# Patient Record
Sex: Male | Born: 1995 | Race: Black or African American | Hispanic: No | Marital: Single | Smoking: Never smoker
Health system: Southern US, Community
[De-identification: ages and names within clinical notes are randomized; demographics above are authoritative.]

## PROBLEM LIST (undated history)

## (undated) DIAGNOSIS — J45909 Unspecified asthma, uncomplicated: Secondary | ICD-10-CM

## (undated) HISTORY — DX: Unspecified asthma, uncomplicated: J45.909

---

## 2014-03-09 ENCOUNTER — Encounter: Payer: Self-pay | Admitting: Family

## 2014-03-09 ENCOUNTER — Ambulatory Visit (INDEPENDENT_AMBULATORY_CARE_PROVIDER_SITE_OTHER): Payer: Self-pay | Admitting: Family

## 2014-03-09 VITALS — BP 100/62 | HR 80 | Ht 73.0 in | Wt 145.0 lb

## 2014-03-09 DIAGNOSIS — J069 Acute upper respiratory infection, unspecified: Secondary | ICD-10-CM

## 2014-03-09 DIAGNOSIS — Z23 Encounter for immunization: Secondary | ICD-10-CM

## 2014-03-09 DIAGNOSIS — G4700 Insomnia, unspecified: Secondary | ICD-10-CM

## 2014-03-09 LAB — CBC WITH DIFFERENTIAL/PLATELET
BASOS ABS: 0 10*3/uL (ref 0.0–0.1)
Basophils Relative: 0.4 % (ref 0.0–3.0)
EOS ABS: 0.1 10*3/uL (ref 0.0–0.7)
Eosinophils Relative: 1.8 % (ref 0.0–5.0)
HEMATOCRIT: 46.4 % (ref 39.0–52.0)
Hemoglobin: 15 g/dL (ref 13.0–17.0)
LYMPHS ABS: 1.6 10*3/uL (ref 0.7–4.0)
Lymphocytes Relative: 21.4 % (ref 12.0–46.0)
MCHC: 32.3 g/dL (ref 30.0–36.0)
MCV: 93.7 fl (ref 78.0–100.0)
MONO ABS: 0.7 10*3/uL (ref 0.1–1.0)
Monocytes Relative: 9.1 % (ref 3.0–12.0)
NEUTROS ABS: 5 10*3/uL (ref 1.4–7.7)
Neutrophils Relative %: 67.3 % (ref 43.0–77.0)
Platelets: 210 10*3/uL (ref 150.0–400.0)
RBC: 4.95 Mil/uL (ref 4.22–5.81)
RDW: 13.9 % (ref 11.5–14.6)
WBC: 7.4 10*3/uL (ref 4.5–10.5)

## 2014-03-09 LAB — TSH: TSH: 0.64 u[IU]/mL (ref 0.35–5.50)

## 2014-03-09 LAB — COMPREHENSIVE METABOLIC PANEL
ALT: 17 U/L (ref 0–53)
AST: 26 U/L (ref 0–37)
Albumin: 3.7 g/dL (ref 3.5–5.2)
Alkaline Phosphatase: 80 U/L (ref 39–117)
BUN: 11 mg/dL (ref 6–23)
CO2: 29 meq/L (ref 19–32)
CREATININE: 1.1 mg/dL (ref 0.4–1.5)
Calcium: 9.6 mg/dL (ref 8.4–10.5)
Chloride: 103 mEq/L (ref 96–112)
GFR: 111.35 mL/min (ref >60.00)
Glucose, Bld: 98 mg/dL (ref 70–99)
Potassium: 4.8 mEq/L (ref 3.5–5.1)
Sodium: 142 mEq/L (ref 135–145)
Total Bilirubin: 2.7 mg/dL — ABNORMAL HIGH (ref 0.2–1.2)
Total Protein: 7.8 g/dL (ref 6.0–8.3)

## 2014-03-09 LAB — T3: T3, Total: 81.6 ng/dL (ref 80.0–204.0)

## 2014-03-09 LAB — T4: T4, Total: 5.6 ug/dL (ref 4.5–12.0)

## 2014-03-09 NOTE — Progress Notes (Signed)
Pre visit review using our clinic review tool, if applicable. No additional management support is needed unless otherwise documented below in the visit note. 

## 2014-03-09 NOTE — Progress Notes (Signed)
   Subjective:    Patient ID: Dan Martinez, male    DOB: 12/05/1995, 18 y.o.   MRN: [id number]  Cough   18 year old [place], new patient to the practice is in today to be established. Has concerns insomnia that has been a long time issue for several years. Reports his mother used to give him a sleep aid at night that helped. However, since she has been deceased he is unsure what she was given him. Reports loud snoring. Father has sleep apnea. Often feels fatigued the next morning.  He also has concerns of cough, congestion x2 days. Has been taking over-the-counter Mucinex that does help. Denies any fever or chills.   Review of Systems  HENT: Positive for congestion.   Eyes: Negative.   Respiratory: Positive for cough.   Cardiovascular: Negative.   Gastrointestinal: Negative.   Endocrine: Negative.   Genitourinary: Negative.   Musculoskeletal: Negative.   Skin: Negative.   Neurological: Negative.   Hematological: Negative.   Psychiatric/Behavioral: Positive for sleep disturbance.   Past Medical History  Diagnosis Date  . Asthma     History   Social History  . Marital Status: Single    Spouse Name: N/A    Number of Children: N/A  . Years of Education: N/A   Occupational History  . Not on file.   Social History Main Topics  . Smoking status: Never Smoker   . Smokeless tobacco: Not on file  . Alcohol Use: Not on file  . Drug Use: Not on file  . Sexual Activity: Not on file   Other Topics Concern  . Not on file   Social History Narrative  . No narrative on file    History reviewed. No pertinent past surgical history.  Family History  Problem Relation Age of Onset  . Sudden death Mother   . Hypertension Father   . Diabetes Father     No Known Allergies  No current outpatient prescriptions on file prior to visit.   No current facility-administered medications on file prior to visit.    BP 100/62  Pulse 80  Ht 6\' 1"  (1.854 m)  Wt 145 lb  (65.772 kg)  BMI 19.13 kg/m2chart    Objective:   Physical Exam  Constitutional: He is oriented to person, place, and time. He appears well-developed and well-nourished.  HENT:  Right Ear: External ear normal.  Left Ear: External ear normal.  Nose: Nose normal.  Mouth/Throat: Oropharynx is clear and moist.  Neck: Normal range of motion. Neck supple. No thyromegaly present.  Cardiovascular: Normal rate, regular rhythm and normal heart sounds.   Pulmonary/Chest: Effort normal and breath sounds normal.  Musculoskeletal: Normal range of motion.  Neurological: He is alert and oriented to person, place, and time.  Skin: Skin is warm and dry.  Psychiatric: He has a normal mood and affect.          Assessment & Plan:  Dan Martinez was seen today for establish care, cough, nasal congestion and insomnia.  Diagnoses and associated orders for this visit:  Acute upper respiratory infection - TSH - T3 - T4 - CBC with Differential - CMP  Insomnia - TSH - T3 - T4 - CBC with Differential - CMP  Encounter for immunization    consider overnight oximetry to evaluate for sleep apnea in his father has it. Consider trazodone or Benadryl at night for insomnia. We'll make a decision pending labs.

## 2014-03-09 NOTE — Patient Instructions (Addendum)
1. Zyrtec D or Claritin D OTC twice a day.   Upper Respiratory Infection, Adult An upper respiratory infection (URI) is also sometimes known as the common cold. The upper respiratory tract includes the nose, sinuses, throat, trachea, and bronchi. Bronchi are the airways leading to the lungs. Most people improve within 1 week, but symptoms can last up to 2 weeks. A residual cough may last even longer.  CAUSES Many different viruses can infect the tissues lining the upper respiratory tract. The tissues become irritated and inflamed and often become very moist. Mucus production is also common. A cold is contagious. You can easily spread the virus to others by oral contact. This includes kissing, sharing a glass, coughing, or sneezing. Touching your mouth or nose and then touching a surface, which is then touched by another person, can also spread the virus. SYMPTOMS  Symptoms typically develop 1 to 3 days after you come in contact with a cold virus. Symptoms vary from person to person. They may include:  Runny nose.  Sneezing.  Nasal congestion.  Sinus irritation.  Sore throat.  Loss of voice (laryngitis).  Cough.  Fatigue.  Muscle aches.  Loss of appetite.  Headache.  Low-grade fever. DIAGNOSIS  You might diagnose your own cold based on familiar symptoms, since most people get a cold 2 to 3 times a year. Your caregiver can confirm this based on your exam. Most importantly, your caregiver can check that your symptoms are not due to another disease such as strep throat, sinusitis, pneumonia, asthma, or epiglottitis. Blood tests, throat tests, and X-rays are not necessary to diagnose a common cold, but they may sometimes be helpful in excluding other more serious diseases. Your caregiver will decide if any further tests are required. RISKS AND COMPLICATIONS  You may be at risk for a more severe case of the common cold if you smoke cigarettes, have chronic heart disease (such as heart  failure) or lung disease (such as asthma), or if you have a weakened immune system. The very young and very old are also at risk for more serious infections. Bacterial sinusitis, middle ear infections, and bacterial pneumonia can complicate the common cold. The common cold can worsen asthma and chronic obstructive pulmonary disease (COPD). Sometimes, these complications can require emergency medical care and may be life-threatening. PREVENTION  The best way to protect against getting a cold is to practice good hygiene. Avoid oral or hand contact with people with cold symptoms. Wash your hands often if contact occurs. There is no clear evidence that vitamin C, vitamin E, echinacea, or exercise reduces the chance of developing a cold. However, it is always recommended to get plenty of rest and practice good nutrition. TREATMENT  Treatment is directed at relieving symptoms. There is no cure. Antibiotics are not effective, because the infection is caused by a virus, not by bacteria. Treatment may include:  Increased fluid intake. Sports drinks offer valuable electrolytes, sugars, and fluids.  Breathing heated mist or steam (vaporizer or shower).  Eating chicken soup or other clear broths, and maintaining good nutrition.  Getting plenty of rest.  Using gargles or lozenges for comfort.  Controlling fevers with ibuprofen or acetaminophen as directed by your caregiver.  Increasing usage of your inhaler if you have asthma. Zinc gel and zinc lozenges, taken in the first 24 hours of the common cold, can shorten the duration and lessen the severity of symptoms. Pain medicines may help with fever, muscle aches, and throat pain. A variety of  non-prescription medicines are available to treat congestion and runny nose. Your caregiver can make recommendations and may suggest nasal or lung inhalers for other symptoms.  HOME CARE INSTRUCTIONS   Only take over-the-counter or prescription medicines for pain,  discomfort, or fever as directed by your caregiver.  Use a warm mist humidifier or inhale steam from a shower to increase air moisture. This may keep secretions moist and make it easier to breathe.  Drink enough water and fluids to keep your urine clear or pale yellow.  Rest as needed.  Return to work when your temperature has returned to normal or as your caregiver advises. You may need to stay home longer to avoid infecting others. You can also use a face mask and careful hand washing to prevent spread of the virus. SEEK MEDICAL CARE IF:   After the first few days, you feel you are getting worse rather than better.  You need your caregiver's advice about medicines to control symptoms.  You develop chills, worsening shortness of breath, or brown or red sputum. These may be signs of pneumonia.  You develop yellow or brown nasal discharge or pain in the face, especially when you bend forward. These may be signs of sinusitis.  You develop a fever, swollen neck glands, pain with swallowing, or white areas in the back of your throat. These may be signs of strep throat. SEEK IMMEDIATE MEDICAL CARE IF:   You have a fever.  You develop severe or persistent headache, ear pain, sinus pain, or chest pain.  You develop wheezing, a prolonged cough, cough up blood, or have a change in your usual mucus (if you have chronic lung disease).  You develop sore muscles or a stiff neck. Document Released: 11/01/2000 Document Revised: 07/31/2011 Document Reviewed: 08/13/2013 [facility] Patient Information 2015 [place], [place]. This information is not intended to replace advice given to you by your health care provider. Make sure you discuss any questions you have with your health care provider.   Insomnia Insomnia is frequent trouble falling and/or staying asleep. Insomnia can be a long term problem or a short term problem. Both are common. Insomnia can be a short term problem when the wakefulness is  related to a certain stress or worry. Long term insomnia is often related to ongoing stress during waking hours and/or poor sleeping habits. Overtime, sleep deprivation itself can make the problem worse. Every little thing feels more severe because you are overtired and your ability to cope is decreased. CAUSES   Stress, anxiety, and depression.  Poor sleeping habits.  Distractions such as TV in the bedroom.  Naps close to bedtime.  Engaging in emotionally charged conversations before bed.  Technical reading before sleep.  Alcohol and other sedatives. They may make the problem worse. They can hurt normal sleep patterns and normal dream activity.  Stimulants such as caffeine for several hours prior to bedtime.  Pain syndromes and shortness of breath can cause insomnia.  Exercise late at night.  Changing time zones may cause sleeping problems (jet lag). It is sometimes helpful to have someone observe your sleeping patterns. They should look for periods of not breathing during the night (sleep apnea). They should also look to see how long those periods last. If you live alone or observers are uncertain, you can also be observed at a sleep clinic where your sleep patterns will be professionally monitored. Sleep apnea requires a checkup and treatment. Give your caregivers your medical history. Give your caregivers observations your family  has made about your sleep.  SYMPTOMS   Not feeling rested in the morning.  Anxiety and restlessness at bedtime.  Difficulty falling and staying asleep. TREATMENT   Your caregiver may prescribe treatment for an underlying medical disorders. Your caregiver can give advice or help if you are using alcohol or other drugs for self-medication. Treatment of underlying problems will usually eliminate insomnia problems.  Medications can be prescribed for short time use. They are generally not recommended for lengthy use.  Over-the-counter sleep medicines are  not recommended for lengthy use. They can be habit forming.  You can promote easier sleeping by making lifestyle changes such as:  Using relaxation techniques that help with breathing and reduce muscle tension.  Exercising earlier in the day.  Changing your diet and the time of your last meal. No night time snacks.  Establish a regular time to go to bed.  Counseling can help with stressful problems and worry.  Soothing music and white noise may be helpful if there are background noises you cannot remove.  Stop tedious detailed work at least one hour before bedtime. HOME CARE INSTRUCTIONS   Keep a diary. Inform your caregiver about your progress. This includes any medication side effects. See your caregiver regularly. Take note of:  Times when you are asleep.  Times when you are awake during the night.  The quality of your sleep.  How you feel the next day. This information will help your caregiver care for you.  Get out of bed if you are still awake after 15 minutes. Read or do some quiet activity. Keep the lights down. Wait until you feel sleepy and go back to bed.  Keep regular sleeping and waking hours. Avoid naps.  Exercise regularly.  Avoid distractions at bedtime. Distractions include watching television or engaging in any intense or detailed activity like attempting to balance the household checkbook.  Develop a bedtime ritual. Keep a familiar routine of bathing, brushing your teeth, climbing into bed at the same time each night, listening to soothing music. Routines increase the success of falling to sleep faster.  Use relaxation techniques. This can be using breathing and muscle tension release routines. It can also include visualizing peaceful scenes. You can also help control troubling or intruding thoughts by keeping your mind occupied with boring or repetitive thoughts like the old concept of counting sheep. You can make it more creative like imagining planting  one beautiful flower after another in your backyard garden.  During your day, work to eliminate stress. When this is not possible use some of the previous suggestions to help reduce the anxiety that accompanies stressful situations. MAKE SURE YOU:   Understand these instructions.  Will watch your condition.  Will get help right away if you are not doing well or get worse. Document Released: 05/05/2000 Document Revised: 07/31/2011 Document Reviewed: 06/05/2007 [facility] Patient Information 2015 [place], [place]. This information is not intended to replace advice given to you by your health care provider. Make sure you discuss any questions you have with your health care provider.

## 2014-03-10 ENCOUNTER — Other Ambulatory Visit: Payer: Self-pay | Admitting: Family

## 2014-03-10 DIAGNOSIS — G4700 Insomnia, unspecified: Secondary | ICD-10-CM

## 2014-03-10 DIAGNOSIS — Z820 Family history of epilepsy and other diseases of the nervous system: Secondary | ICD-10-CM

## 2014-03-10 DIAGNOSIS — R5383 Other fatigue: Secondary | ICD-10-CM

## 2014-04-10 ENCOUNTER — Telehealth: Payer: Self-pay | Admitting: Family

## 2014-04-10 NOTE — Telephone Encounter (Signed)
Pt would like sleep apnea results. Pt is aware NP out of office

## 2014-04-10 NOTE — Telephone Encounter (Signed)
Pt aware results have been received and will be reviewed by provider next week. I will call pt once results are reviewed

## 2014-04-13 ENCOUNTER — Telehealth: Payer: Self-pay | Admitting: Family

## 2014-04-13 ENCOUNTER — Other Ambulatory Visit: Payer: Self-pay | Admitting: Family

## 2014-04-13 DIAGNOSIS — G4734 Idiopathic sleep related nonobstructive alveolar hypoventilation: Secondary | ICD-10-CM

## 2014-04-13 NOTE — Telephone Encounter (Signed)
Pt would like a call back about sleep apnea results

## 2014-04-13 NOTE — Telephone Encounter (Signed)
[facility]. Per Oran ReinPadonda, refer to pulmonology due to desaturation indicated on sleep study.   Pt aware and referral placed

## 2014-05-01 ENCOUNTER — Telehealth: Payer: Self-pay | Admitting: Family

## 2014-05-01 ENCOUNTER — Ambulatory Visit (INDEPENDENT_AMBULATORY_CARE_PROVIDER_SITE_OTHER): Payer: Managed Care, Other (non HMO) | Admitting: Pulmonary Disease

## 2014-05-01 ENCOUNTER — Encounter: Payer: Self-pay | Admitting: Family

## 2014-05-01 ENCOUNTER — Encounter: Payer: Self-pay | Admitting: *Deleted

## 2014-05-01 VITALS — BP 110/62 | HR 66 | Ht 74.0 in | Wt 146.0 lb

## 2014-05-01 DIAGNOSIS — F5104 Psychophysiologic insomnia: Secondary | ICD-10-CM

## 2014-05-01 DIAGNOSIS — G4734 Idiopathic sleep related nonobstructive alveolar hypoventilation: Secondary | ICD-10-CM

## 2014-05-01 NOTE — Telephone Encounter (Signed)
error 

## 2014-05-01 NOTE — Assessment & Plan Note (Signed)
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 I have explained that medications are not the way to treat this problem, and would recommend referral to a behavioral psychologist for CBT. I have discussed with him sleep restriction therapy, and the importance of starting his day by 8:00 in order to maintain regular schedule.

## 2014-05-01 NOTE — Progress Notes (Signed)
Subjective:    Patient ID: Estill CottaJalen Finch, male    DOB: 08/15/1995, 18 y.o.   MRN: [id number]  HPI The patient is a 18 year old male who I've been asked to see for possible obstructive sleep apnea. He has a long-standing history of chronic insomnia, and apparently had a recent overnight oximetry that was abnormal. I do not see any documentation in the chart, and the report is not available to me currently. The question has been raised whether he may have obstructive sleep apnea, and he tells me that his father does have sleep disordered breathing. He has been noted to have loud snoring, but no one has ever commented on an abnormal breathing pattern during sleep. He typically will sleep during the day, from 5 AM to 3 PM. He is not rested at least 60% of the time upon arising. He does note some sleepiness with inactivity while awake, but it is variable. He feels that sleepiness does impact his schoolwork and his study time. He has intermittent sleepiness watching television or movies, and no sleepiness with driving. The patient states that his weight is unchanged over the last few years.    Sleep Questionnaire What time do you typically go to bed?( Between what hours) 5am-10am 5am-10am at 1140 on 05/01/14 by Tommie SamsMindy S Silva, CMA How long does it take you to fall asleep? 5-10 min 5-10 min at 1140 on 05/01/14 by Tommie SamsMindy S Silva, CMA How many times during the night do you wake up? 2 2 at 1140 on 05/01/14 by Tommie SamsMindy S Silva, CMA What time do you get out of bed to start your day? 0200 0200 at 1140 on 05/01/14 by Tommie SamsMindy S Silva, CMA Do you drive or operate heavy machinery in your occupation? No No at 1140 on 05/01/14 by Tommie SamsMindy S Silva, CMA How much has your weight changed (up or down) over the past two years? (In pounds) 0 oz (0 kg) 0 oz (0 kg) at 1140 on 05/01/14 by Tommie SamsMindy S Silva, CMA Have you ever had a sleep study before? No No at 1140 on 05/01/14 by Tommie SamsMindy S Silva, CMA Do you currently use CPAP?  No No at 1140 on 05/01/14 by Tommie SamsMindy S Silva, CMA Do you wear oxygen at any time? No No at 1140 on 05/01/14 by Tommie SamsMindy S Silva, CMA   Review of Systems  Constitutional: Positive for appetite change. Negative for fever and unexpected weight change.  HENT: Negative for congestion, dental problem, ear pain, nosebleeds, postnasal drip, rhinorrhea, sinus pressure, sneezing, sore throat and trouble swallowing.   Eyes: Negative for redness and itching.  Respiratory: Negative for cough, chest tightness, shortness of breath and wheezing.   Cardiovascular: Negative for palpitations and leg swelling.  Gastrointestinal: Negative for nausea and vomiting.  Genitourinary: Negative for dysuria.  Musculoskeletal: Negative for joint swelling.  Skin: Negative for rash.  Neurological: Negative for headaches.  Hematological: Does not bruise/bleed easily.  Psychiatric/Behavioral: Negative for dysphoric mood. The patient is not nervous/anxious.        Objective:   Physical Exam Constitutional:  Well developed, no acute distress  HENT:  Nares patent without discharge, septal deviation to the right with narrowing  Oropharynx without exudate, palate and uvula are moderately elongated  Eyes:  Perrla, eomi, no scleral icterus  Neck:  No JVD, no TMG  Cardiovascular:  Normal rate, regular rhythm, no rubs or gallops.  No murmurs        Intact distal pulses  Pulmonary :  Normal breath sounds,  no stridor or respiratory distress   No rales, rhonchi, or wheezing  Abdominal:  Soft, nondistended, bowel sounds present.  No tenderness noted.   Musculoskeletal:  No lower extremity edema noted.  Lymph Nodes:  No cervical lymphadenopathy noted  Skin:  No cyanosis noted  Neurologic:  Alert, appropriate, moves all 4 extremities without obvious deficit.         Assessment & Plan:

## 2014-05-01 NOTE — Patient Instructions (Signed)
Will schedule for a sleep study starting around 5am. Work on getting your sleep schedule more normalized. Will send a note to your primary physician, recommending a referral to behavioral psychology for CBT (cognitive behavioral therapy).  [facility] Will call you once the results of your sleep study is available.

## 2014-05-01 NOTE — Assessment & Plan Note (Signed)
The patient apparently has nocturnal desaturation on his overnight oximetry, but this data is not available to me currently. Have a history of loud snoring, and his father has obstructive sleep apnea. Even though he does not have the classic body habitus for sleep disordered breathing, he does have nonrestorative sleep even when he can sleep 6 or 7 hours at a time. I think he will need to have a sleep study for evaluation, and will arrange this.

## 2014-05-25 ENCOUNTER — Ambulatory Visit (HOSPITAL_BASED_OUTPATIENT_CLINIC_OR_DEPARTMENT_OTHER): Payer: Managed Care, Other (non HMO)

## 2014-05-26 ENCOUNTER — Encounter (HOSPITAL_BASED_OUTPATIENT_CLINIC_OR_DEPARTMENT_OTHER): Payer: Managed Care, Other (non HMO)

## 2014-05-27 ENCOUNTER — Ambulatory Visit (HOSPITAL_BASED_OUTPATIENT_CLINIC_OR_DEPARTMENT_OTHER): Payer: Managed Care, Other (non HMO) | Attending: Pulmonary Disease | Admitting: Radiology

## 2014-05-27 VITALS — Wt 150.0 lb

## 2014-05-27 DIAGNOSIS — F5104 Psychophysiologic insomnia: Secondary | ICD-10-CM | POA: Diagnosis not present

## 2014-05-27 DIAGNOSIS — G4733 Obstructive sleep apnea (adult) (pediatric): Secondary | ICD-10-CM | POA: Insufficient documentation

## 2014-05-27 DIAGNOSIS — G4734 Idiopathic sleep related nonobstructive alveolar hypoventilation: Secondary | ICD-10-CM | POA: Diagnosis not present

## 2014-06-03 DIAGNOSIS — G4733 Obstructive sleep apnea (adult) (pediatric): Secondary | ICD-10-CM

## 2014-06-03 NOTE — Sleep Study (Signed)
   NAME: Dan CottaJalen Martinez DATE OF BIRTH:  1995/08/12 MEDICAL RECORD NUMBER [id number]  LOCATION: [facility]  PHYSICIAN: Barbaraann ShareCLANCE,KEITH M  DATE OF STUDY: 05/27/2014  SLEEP STUDY TYPE: Nocturnal Polysomnogram               REFERRING PHYSICIAN: Clance, Maree KrabbeKeith M, MD  INDICATION FOR STUDY: Hypersomnia with sleep apnea  EPWORTH SLEEPINESS SCORE:  9 HEIGHT:  (6'3")  WEIGHT: 150 lb (68.04 kg)    Cannot calculate BMI with a height equal to zero.  NECK SIZE: 13 in.  MEDICATIONS: Reviewed in the sleep record  SLEEP ARCHITECTURE: The patient had a total sleep time of 307 minutes, with no slow-wave sleep and only 87 minutes of REM. Sleep onset latency was normal at 30 minutes, and REM onset was normal at 52 minutes. Sleep efficiency was mildly reduced at 85%.  RESPIRATORY DATA: The patient was found to have 2 apneas and no obstructive hypopneas, giving him an AHI of only 0.4 events per hour. The events occurred all during REM, and there was very loud snoring noted throughout. It should be noted the patient did not have any significant respiratory effort-related arousals.  OXYGEN DATA: There was oxygen desaturation transiently as low as 94%  CARDIAC DATA: No clinically significant arrhythmias were noted  MOVEMENT/PARASOMNIA: No significant limb movements or other abnormal behaviors were seen.  IMPRESSION/ RECOMMENDATION:    1) Small numbers of obstructive events which do not meet the AHI criteria for the obstructive sleep apnea syndrome. The patient did have loud snoring, but had no evidence for the upper airway resistant syndrome.    Barbaraann ShareLANCE,KEITH M Diplomate, American Board of Sleep Medicine  ELECTRONICALLY SIGNED ON:  06/03/2014, 6:13 PM [facility] PH: [phone]   FX: [phone] ACCREDITED BY THE AMERICAN ACADEMY OF SLEEP MEDICINE

## 2014-06-03 NOTE — Progress Notes (Signed)
Dan Martinez, please let pt know that his sleep study did not show sleep apnea.  He did have loud snoring.  I think a lot of his issues are related to his schedule and sleep hygiene as we discussed.  If he continues to have issues with insomnia, he needs to discuss referral to behavioral psychology with his primary md.

## 2014-06-04 NOTE — Progress Notes (Signed)
I spoke with patient about results and he verbalized understanding and had no questions 

## 2014-12-29 ENCOUNTER — Telehealth: Payer: Self-pay | Admitting: Pulmonary Disease

## 2014-12-29 NOTE — Telephone Encounter (Signed)
Pt in lobby requesting a letter saying that he was seen in our office and had a sleep study.  This has been typed and given to patient.  Nothing further needed at this time.

## 2014-12-31 ENCOUNTER — Encounter: Payer: Self-pay | Admitting: *Deleted

## 2015-09-23 ENCOUNTER — Emergency Department (HOSPITAL_COMMUNITY)
Admission: EM | Admit: 2015-09-23 | Discharge: 2015-09-24 | Disposition: A | Discharge status: Home / Self Care | Payer: Managed Care, Other (non HMO) | Attending: Emergency Medicine | Admitting: Emergency Medicine

## 2015-09-23 ENCOUNTER — Emergency Department (HOSPITAL_COMMUNITY): Payer: Managed Care, Other (non HMO)

## 2015-09-23 ENCOUNTER — Encounter (HOSPITAL_COMMUNITY): Payer: Self-pay

## 2015-09-23 DIAGNOSIS — Y9289 Other specified places as the place of occurrence of the external cause: Secondary | ICD-10-CM | POA: Diagnosis not present

## 2015-09-23 DIAGNOSIS — S5292XA Unspecified fracture of left forearm, initial encounter for closed fracture: Secondary | ICD-10-CM

## 2015-09-23 DIAGNOSIS — Y998 Other external cause status: Secondary | ICD-10-CM | POA: Insufficient documentation

## 2015-09-23 DIAGNOSIS — Y9367 Activity, basketball: Secondary | ICD-10-CM | POA: Insufficient documentation

## 2015-09-23 DIAGNOSIS — J45909 Unspecified asthma, uncomplicated: Secondary | ICD-10-CM | POA: Diagnosis not present

## 2015-09-23 DIAGNOSIS — W1839XA Other fall on same level, initial encounter: Secondary | ICD-10-CM | POA: Diagnosis not present

## 2015-09-23 DIAGNOSIS — S50812A Abrasion of left forearm, initial encounter: Secondary | ICD-10-CM | POA: Insufficient documentation

## 2015-09-23 DIAGNOSIS — S52602A Unspecified fracture of lower end of left ulna, initial encounter for closed fracture: Secondary | ICD-10-CM | POA: Insufficient documentation

## 2015-09-23 DIAGNOSIS — S6992XA Unspecified injury of left wrist, hand and finger(s), initial encounter: Secondary | ICD-10-CM | POA: Diagnosis present

## 2015-09-23 NOTE — ED Provider Notes (Signed)
CSN: [id number]     Arrival date & time 09/23/15  2246 History  By signing my name below, I, Freida Busmaniana Omoyeni, attest that this documentation has been prepared under the direction and in the presence of non-physician practitioner, Wynetta EmeryNicole Marcell Pfeifer, PA-C. Electronically Signed: Freida Busmaniana Omoyeni, Scribe. 09/23/2015. 11:45 PM.    Chief Complaint  Patient presents with  . Wrist Injury   The history is provided by the patient. No language interpreter was used.     HPI Comments:  Dan Martinez is a 20 y.o. male who presents to the Emergency Department complaining of 6.8/10 constant pain to the left wrist following injury today. Pt was playing basketball when he fell backward onto outstretched left arm; no LOC or head injury. No alleviating factors noted. Pt has no other complaints or symptoms at this time. Pt is right hand dominant.   Past Medical History  Diagnosis Date  . Asthma    History reviewed. No pertinent past surgical history. Family History  Problem Relation Age of Onset  . Sudden death Mother   . Hypertension Father   . Diabetes Father    Social History  Substance Use Topics  . Smoking status: Never Smoker   . Smokeless tobacco: Never Used  . Alcohol Use: No    Review of Systems  10 systems reviewed and all are negative for acute change except as noted in the HPI.  Allergies  Review of patient's allergies indicates no known allergies.  Home Medications   Prior to Admission medications   Not on File   BP 122/98 mmHg  Pulse 88  Temp(Src) 98.6 F (37 C) (Oral)  Resp 16  SpO2 100% Physical Exam  Constitutional: He is oriented to person, place, and time. He appears well-developed and well-nourished. No distress.  HENT:  Head: Normocephalic and atraumatic.  Eyes: Conjunctivae and EOM are normal.  Cardiovascular: Normal rate.   Pulmonary/Chest: Effort normal. No stridor.  Abdominal: He exhibits no distension.  Musculoskeletal: He exhibits edema and tenderness.  Left  forearm with positive deformity, he has a partial-thickness abrasions to ulnar, volar aspect. Radial pulses 2+, distal sensation is intact with good range of motion to all fingers, no tenderness to palpation along the elbow or shoulder.  Neurological: He is alert and oriented to person, place, and time.  Skin: Skin is warm and dry.  Psychiatric: He has a normal mood and affect.  Nursing note and vitals reviewed.   ED Course  Procedures   DIAGNOSTIC STUDIES:  Oxygen Saturation is 100% on RA, normal by my interpretation.    COORDINATION OF CARE:  11:41 PM Discussed treatment plan with pt at bedside and pt agreed to plan.  Labs Review Labs Reviewed - No data to display  Imaging Review No results found. I have personally reviewed and evaluated these images and lab results as part of my medical decision-making.   MDM   Final diagnoses:  Left forearm fracture, closed, initial encounter    Filed Vitals:   09/23/15 2317  BP: 122/98  Pulse: 88  Temp: 98.6 F (37 C)  TempSrc: Oral  Resp: 16  SpO2: 100%    Medications  morphine 4 MG/ML injection 4 mg (4 mg Intramuscular Given 09/24/15 0006)    Dan Martinez is 20 y.o. male presenting with Deformity to nondominant forearm. Closed wound, neurovascularly intact, x-ray shows a midshaft radial fracture with deformity, also a ulnar fracture. Reviewed films with attending physician, recommends splinting and outpatient hand surgery follow-up, extensive discussion of return  precautions and patient verbalizes understanding.  Evaluation does not show pathology that would require ongoing emergent intervention or inpatient treatment. Pt is hemodynamically stable and mentating appropriately. Discussed findings and plan with patient/guardian, who agrees with care plan. All questions answered. Return precautions discussed and outpatient follow up given.   New Prescriptions   HYDROCODONE-ACETAMINOPHEN (NORCO/VICODIN) 5-325 MG TABLET    Take 1-2  tablets by mouth every 6 hours as needed for pain and/or cough.     I personally performed the services described in this documentation, which was scribed in my presence. The recorded information has been reviewed and is accurate.    Wynetta Emery, PA-C 09/24/15 0145  Gilda Crease, MD 09/24/15 [phone]

## 2015-09-23 NOTE — ED Notes (Signed)
Pt was playing basketball tonight when he almost fell and had to brace his fall with his left wrist. Pt has ice pack to left wrist. Pt is able to wiggle his fingers. + radial pulse.

## 2015-09-24 MED ORDER — MORPHINE SULFATE (PF) 4 MG/ML IV SOLN
4.0000 mg | Freq: Once | INTRAVENOUS | Status: AC
  Administered 2015-09-24: 4 mg via INTRAMUSCULAR
  Filled 2015-09-24: qty 1

## 2015-09-24 MED ORDER — HYDROCODONE-ACETAMINOPHEN 5-325 MG PO TABS: ORAL_TABLET | ORAL | Status: DC

## 2015-09-24 NOTE — Discharge Instructions (Signed)
Take vicodin for breakthrough pain, do not drink alcohol, drive, care for children or do other critical tasks while taking vicodin.  Please follow with your primary care doctor in the next 2 days for a check-up. They must obtain records for further management.   Do not hesitate to return to the Emergency Department for any new, worsening or concerning symptoms.    Forearm Fracture A forearm fracture is a break in one or both of the bones of your arm that are between the elbow and the wrist. Your forearm is made up of two bones:  Radius. This is the bone on the inside of your arm near your thumb.  Ulna. This is the bone on the outside of your arm near your little finger. Middle forearm fractures usually break both the radius and the ulna. Most forearm fractures that involve both the ulna and radius will require surgery. CAUSES Common causes of this type of fracture include:  Falling on an outstretched arm.  Accidents, such as a car or bike accident.  A hard, direct hit to the middle part of your arm. RISK FACTORS You may be at higher risk for this type of fracture if:  You play contact sports.  You have a condition that causes your bones to be weak or thin (osteoporosis). SIGNS AND SYMPTOMS A forearm fracture causes pain immediately after the injury. Other signs and symptoms include:  An abnormal bend or bump in your arm (deformity).  Swelling.  Numbness or tingling.  Tenderness.  Inability to turn your hand from side to side (rotate).  Bruising. DIAGNOSIS Your health care provider may diagnose a forearm fracture based on:  Your symptoms.  Your medical history, including any recent injury.  A physical exam. Your health care provider will look for any deformity and feel for tenderness over the break. Your health care provider will also check whether the bones are out of place.  An X-ray exam to confirm the diagnosis and learn more about the type of  fracture. TREATMENT The goals of treatment are to get the bone or bones in proper position for healing and to keep the bones from moving so they will heal over time. Your treatment will depend on many factors, especially the type of fracture that you have.  If the fractured bone or bones:  Are in the correct position (nondisplaced), you may only need to wear a cast or a splint.  Have a slightly displaced fracture, you may need to have the bones moved back into place manually (closed reduction) before the splint or cast is put on.  You may have a temporary splint before you have a cast. The splint allows room for some swelling. After a few days, a cast can replace the splint.  You may have to wear the cast for 6-8 weeks or as directed by your health care provider.  The cast may be changed after about 3 weeks or as directed by your health care provider.  After your cast is removed, you may need physical therapy to regain full movement in your wrist or elbow.  You may need emergency surgery if you have:  A fractured bone or bones that are out of position (displaced).  A fracture with multiple fragments (comminuted fracture).  A fracture that breaks the skin (open fracture). This type of fracture may require surgical wires, plates, or screws to hold the bone or bones in place.  You may have X-rays every couple of weeks to check on your  healing. HOME CARE INSTRUCTIONS If You Have a Cast:  Do not stick anything inside the cast to scratch your skin. Doing that increases your risk of infection.  Check the skin around the cast every day. Report any concerns to your health care provider. You may put lotion on dry skin around the edges of the cast. Do not apply lotion to the skin underneath the cast. If You Have a Splint:  Wear it as directed by your health care provider. Remove it only as directed by your health care provider.  Loosen the splint if your fingers become numb and tingle, or  if they turn cold and blue. Bathing  Cover the cast or splint with a watertight plastic bag to protect it from water while you bathe or shower. Do not let the cast or splint get wet. Managing Pain, Stiffness, and Swelling  If directed, apply ice to the injured area:  Put ice in a plastic bag.  Place a towel between your skin and the bag.  Leave the ice on for 20 minutes, 2-3 times a day.  Move your fingers often to avoid stiffness and to lessen swelling.  Raise the injured area above the level of your heart while you are sitting or lying down. Driving  Do not drive or operate heavy machinery while taking pain medicine.  Do not drive while wearing a cast or splint on a hand that you use for driving. Activity  Return to your normal activities as directed by your health care provider. Ask your health care provider what activities are safe for you.  Perform range-of-motion exercises only as directed by your health care provider. Safety  Do not use your injured limb to support your body weight until your health care provider says that you can. General Instructions  Do not put pressure on any part of the cast or splint until it is fully hardened. This may take several hours.  Keep the cast or splint clean and dry.  Do not use any tobacco products, including cigarettes, chewing tobacco, or electronic cigarettes. Tobacco can delay bone healing. If you need help quitting, ask your health care provider.  Take medicines only as directed by your health care provider.  Keep all follow-up visits as directed by your health care provider. This is important. SEEK MEDICAL CARE IF:  Your pain medicine is not helping.  Your cast or splint becomes wet or damaged or suddenly feels too tight.  Your cast becomes loose.  You have more severe pain or swelling than you did before the cast.  You have severe pain when you stretch your fingers.  You continue to have pain or stiffness in your  elbow or your wrist after your cast is removed. SEEK IMMEDIATE MEDICAL CARE IF:  You cannot move your fingers.  You lose feeling in your fingers or your hand.  Your hand or your fingers turn cold and pale or blue.  You notice a bad smell coming from your cast.  You have drainage from underneath your cast.  You have new stains from blood or drainage that is coming through your cast.   This information is not intended to replace advice given to you by your health care provider. Make sure you discuss any questions you have with your health care provider.   Document Released: 05/05/2000 Document Revised: 05/29/2014 Document Reviewed: 12/22/2013 Elsevier Interactive Patient Education [employer]

## 2015-09-24 NOTE — ED Notes (Signed)
Ortho tech at bedside 

## 2015-09-24 NOTE — ED Notes (Signed)
Patient transported to X-ray 

## 2016-05-22 HISTORY — PX: OTHER SURGICAL HISTORY: SHX169

## 2017-12-09 IMAGING — DX DG WRIST COMPLETE 3+V*L*
4 series · 4 of 4 positions shown · non-contrast
Comparison: None.

CLINICAL DATA: Basketball injury, falling backwards onto his
outstretched hand.

EXAM:
LEFT WRIST - COMPLETE 3+ VIEW

[x wrist pa left]
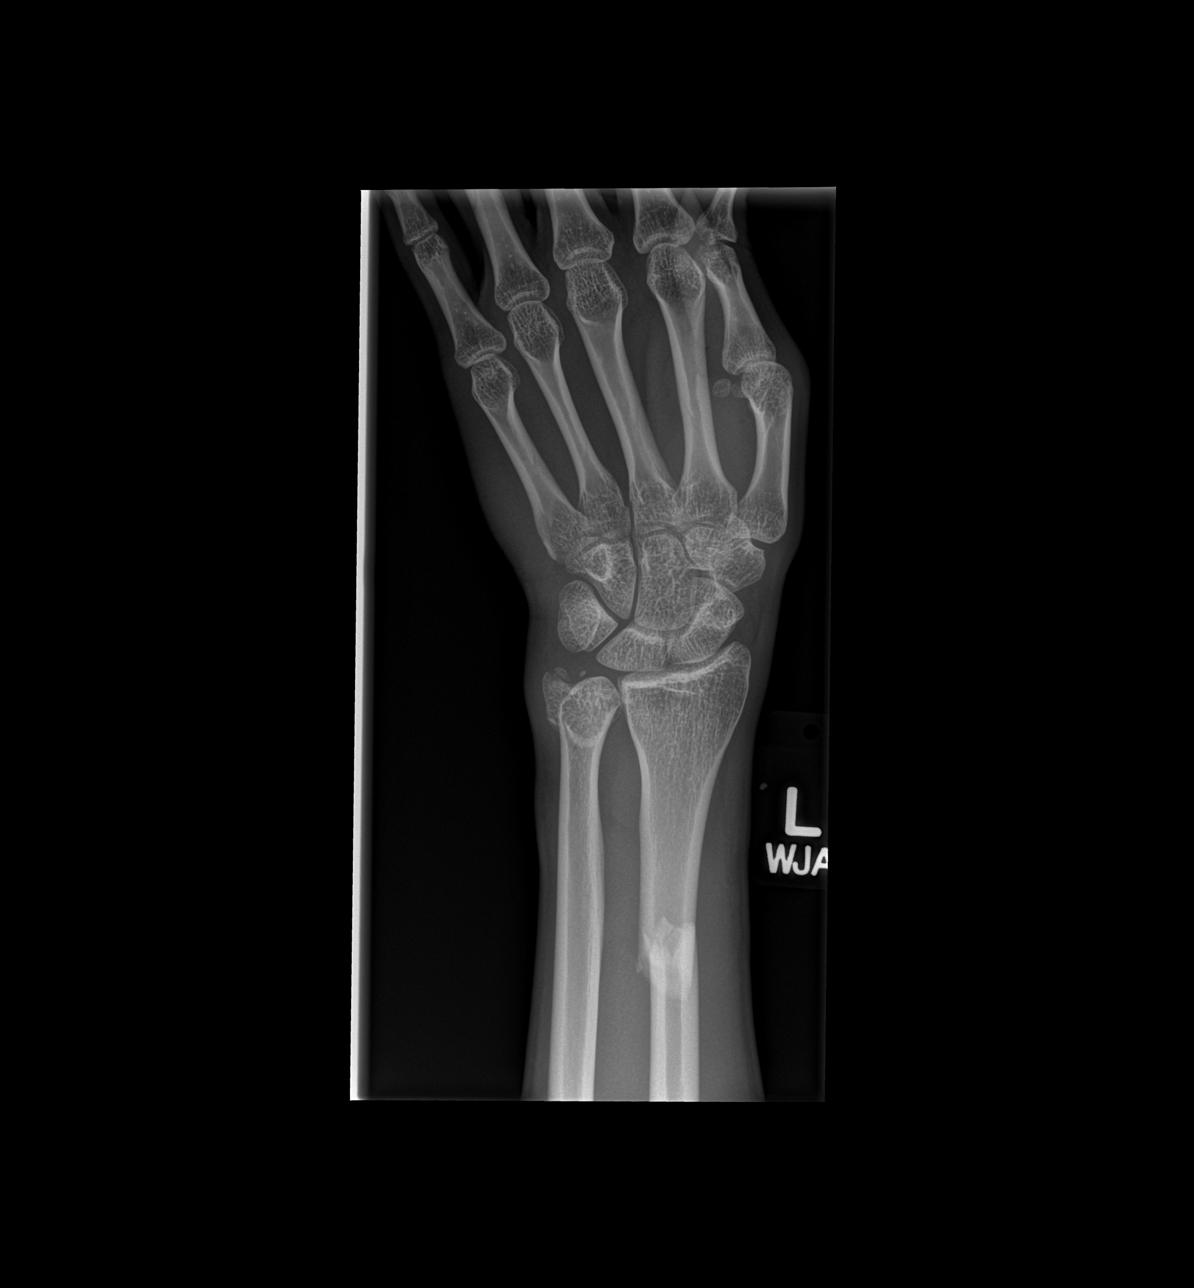

[x wrist obl left]
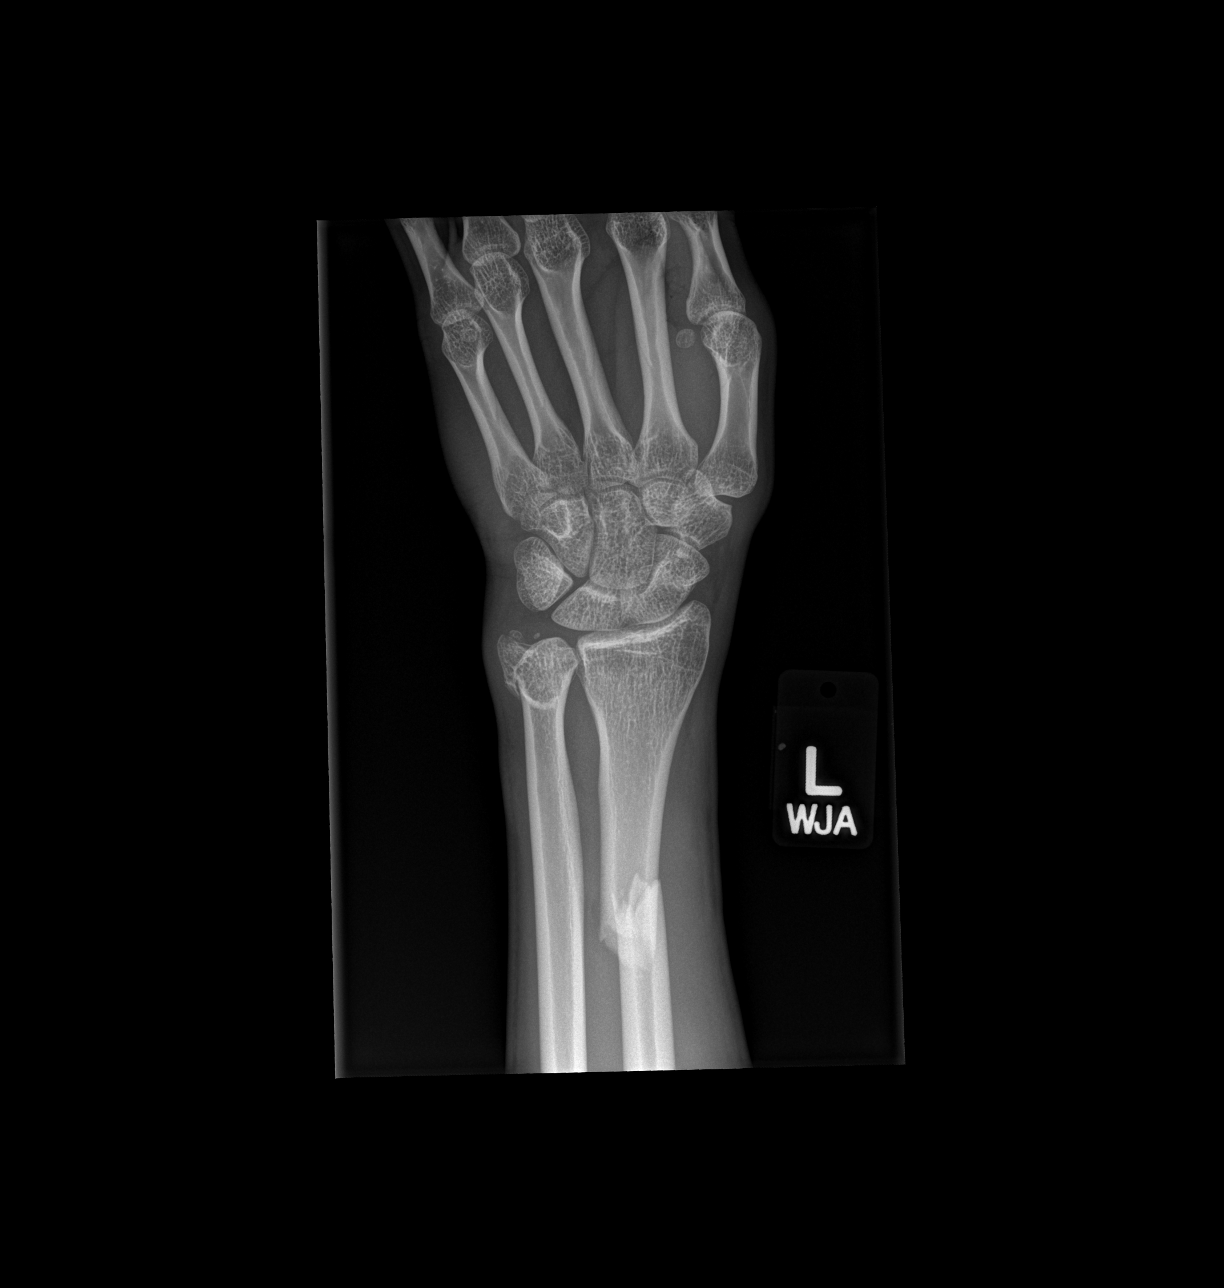

[x wrist lat left]
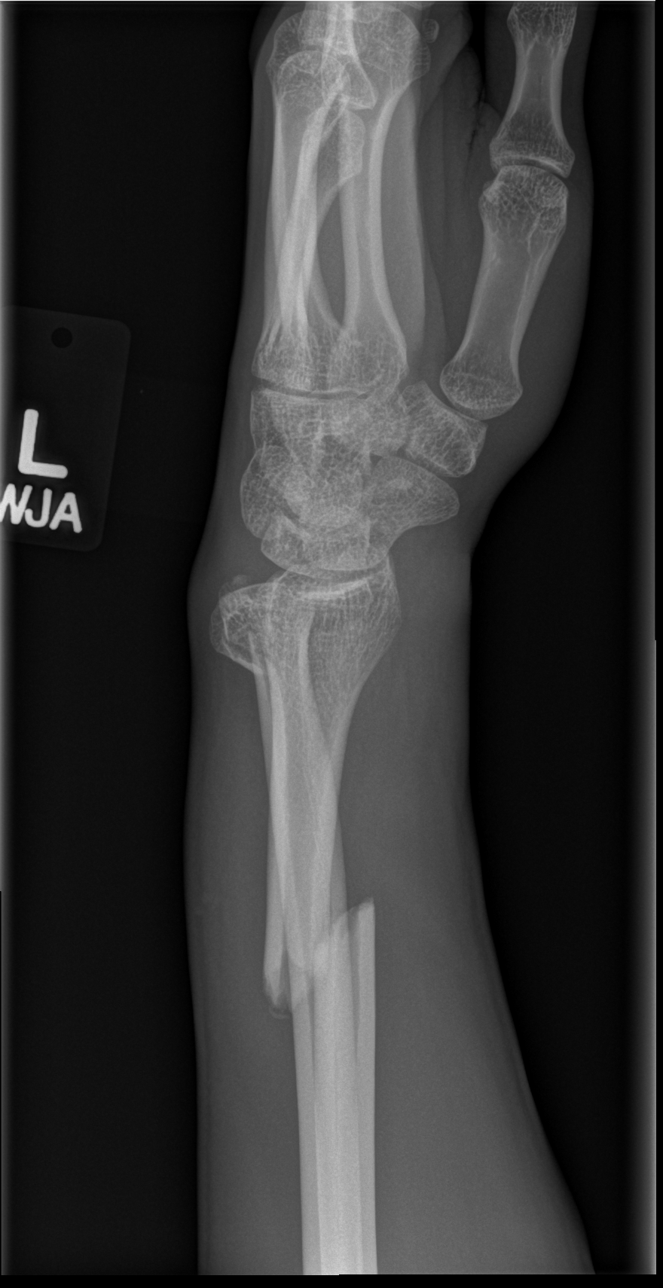

[x wrist navicular view left]
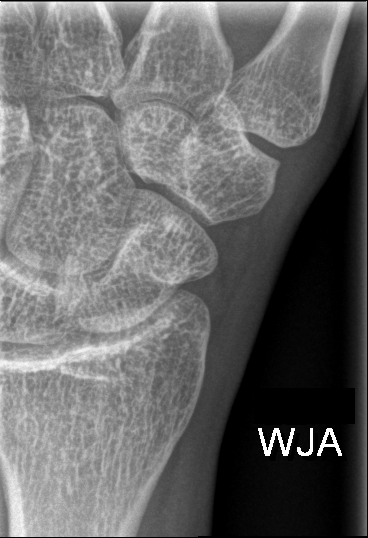

[4 of 4 positions shown; findings below may reference images not displayed]

FINDINGS: There is an acute transverse-oblique fracture of the radial
diaphysis, approximately the junction of the middle and distal
thirds. The fracture is displaced nearly a full shaft width
dorsally.

There is an acute fracture of the distal ulna across the base of the
styloid with mild impaction. There also are corticated fragments
adjacent to the styloid which are more likely remote.

No dislocation.  No radiopaque foreign body.
IMPRESSION: Fracture of the radial diaphysis approximately 6 cm proximal to the
radiocarpal articular surface. Distal ulnar fracture across the
ulnar styloid base.

## 2020-03-07 ENCOUNTER — Other Ambulatory Visit: Payer: Self-pay

## 2020-03-07 ENCOUNTER — Ambulatory Visit (HOSPITAL_COMMUNITY)
Admission: RE | Admit: 2020-03-07 | Discharge: 2020-03-07 | Disposition: A | Discharge status: Home / Self Care | Payer: BC Managed Care – PPO | Source: Ambulatory Visit | Attending: Emergency Medicine | Admitting: Emergency Medicine

## 2020-03-07 ENCOUNTER — Encounter (HOSPITAL_COMMUNITY): Payer: Self-pay

## 2020-03-07 VITALS — BP 115/80 | HR 85 | Temp 98.6°F | Resp 16

## 2020-03-07 DIAGNOSIS — Z20822 Contact with and (suspected) exposure to covid-19: Secondary | ICD-10-CM | POA: Diagnosis not present

## 2020-03-07 DIAGNOSIS — J069 Acute upper respiratory infection, unspecified: Secondary | ICD-10-CM | POA: Insufficient documentation

## 2020-03-07 DIAGNOSIS — J4521 Mild intermittent asthma with (acute) exacerbation: Secondary | ICD-10-CM | POA: Diagnosis not present

## 2020-03-07 LAB — SARS CORONAVIRUS 2 (TAT 6-24 HRS): SARS Coronavirus 2: NEGATIVE

## 2020-03-07 MED ORDER — ALBUTEROL SULFATE HFA 108 (90 BASE) MCG/ACT IN AERS: 1.0000 | INHALATION_SPRAY | Freq: Four times a day (QID) | RESPIRATORY_TRACT | 0 refills | Status: AC | PRN

## 2020-03-07 MED ORDER — BENZONATATE 200 MG PO CAPS: 200.0000 mg | ORAL_CAPSULE | Freq: Three times a day (TID) | ORAL | 0 refills | Status: AC | PRN

## 2020-03-07 MED ORDER — FLUTICASONE PROPIONATE 50 MCG/ACT NA SUSP: 1.0000 | Freq: Every day | NASAL | 0 refills | Status: AC

## 2020-03-07 MED ORDER — DM-GUAIFENESIN ER 30-600 MG PO TB12: 1.0000 | ORAL_TABLET | Freq: Two times a day (BID) | ORAL | 0 refills | Status: AC

## 2020-03-07 MED ORDER — PREDNISONE 50 MG PO TABS
50.0000 mg | ORAL_TABLET | Freq: Every day | ORAL | 0 refills | Status: AC
Start: 2020-03-07 — End: 2020-03-12

## 2020-03-07 NOTE — ED Provider Notes (Signed)
[facility]    CSN: [id number] Arrival date & time: 03/07/20  1352      History   Chief Complaint Chief Complaint  Patient presents with  . Appointment    2:00  . Cough    HPI Kervens Roper is a 24 y.o. male history of asthma presenting today for evaluation of URI symptoms.  Reports over the past 3 to 4 days has had cough congestion chills body aches.  Took it at home Covid test which was negative, but needing confirmation in order to return to work.  Denies fevers.  Has had some slight wheezing and has been using his albuterol inhaler at home.  Reports some rib discomfort associated with coughing.  Denies pain at rest.  Has had some nausea, but relates this to mucus and drainage.  Denies true vomiting.  HPI  Past Medical History:  Diagnosis Date  . Asthma     Patient Active Problem List   Diagnosis Date Noted  . 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 left arm surgery  Left 2018       Home Medications    Prior to Admission medications   Medication Sig Start Date End Date Taking? Authorizing Provider  albuterol (VENTOLIN HFA) 108 (90 Base) MCG/ACT inhaler Inhale 1-2 puffs into the lungs every 6 (six) hours as needed for wheezing or shortness of breath. 03/07/20   Kiel Cockerell C, PA-C  benzonatate (TESSALON) 200 MG capsule Take 1 capsule (200 mg total) by mouth 3 (three) times daily as needed for up to 7 days for cough. 03/07/20 03/14/20  Michelle Vanhise C, PA-C  dextromethorphan-guaiFENesin (MUCINEX DM) 30-600 MG 12hr tablet Take 1 tablet by mouth 2 (two) times daily. 03/07/20   Jeannelle Wiens C, PA-C  fluticasone (FLONASE) 50 MCG/ACT nasal spray Place 1-2 sprays into both nostrils daily. 03/07/20   Shylin Keizer C, PA-C  predniSONE (DELTASONE) 50 MG tablet Take 1 tablet (50 mg total) by mouth daily with breakfast for 5 days. 03/07/20 03/12/20  Haylei Cobin, Junius Creamer,  PA-C    Family History Family History  Problem Relation Age of Onset  . Sudden death Mother   . Hypertension Father   . Diabetes Father     Social History Social History   Tobacco Use  . Smoking status: Never Smoker  . Smokeless tobacco: Never Used  Substance Use Topics  . Alcohol use: No    Alcohol/week: 0.0 standard drinks  . Drug use: No     Allergies   Patient has no known allergies.   Review of Systems Review of Systems  Constitutional: Positive for fatigue. Negative for activity change, appetite change, chills and fever.  HENT: Positive for congestion, rhinorrhea and sore throat. Negative for ear pain, sinus pressure and trouble swallowing.   Eyes: Negative for discharge and redness.  Respiratory: Positive for cough. Negative for chest tightness and shortness of breath.   Cardiovascular: Negative for chest pain.  Gastrointestinal: Positive for nausea. Negative for abdominal pain, diarrhea and vomiting.  Musculoskeletal: Negative for myalgias.  Skin: Negative for rash.  Neurological: Negative for dizziness, light-headedness and headaches.     Physical Exam Triage Vital Signs ED Triage Vitals  Enc Vitals Group     BP      Pulse      Resp      Temp      Temp src      SpO2  Weight      Height      Head Circumference      Peak Flow      Pain Score      Pain Loc      Pain Edu?      Excl. in GC?    No data found.  Updated Vital Signs BP 115/80 (BP Location: Left Arm)   Pulse 85   Temp 98.6 F (37 C) (Oral)   Resp 16   SpO2 98%   Visual Acuity Right Eye Distance:   Left Eye Distance:   Bilateral Distance:    Right Eye Near:   Left Eye Near:    Bilateral Near:     Physical Exam Vitals and nursing note reviewed.  Constitutional:      Appearance: He is well-developed.     Comments: No acute distress  HENT:     Head: Normocephalic and atraumatic.     Ears:     Comments: Bilateral ears without tenderness to palpation of external  auricle, tragus and mastoid, EAC's without erythema or swelling, TM's with good bony landmarks and cone of light. Non erythematous. Large effusion on right TM    Nose: Nose normal.     Mouth/Throat:     Comments: Oral mucosa pink and moist, no tonsillar enlargement or exudate. Posterior pharynx patent and nonerythematous, no uvula deviation or swelling. Normal phonation. Eyes:     Conjunctiva/sclera: Conjunctivae normal.  Cardiovascular:     Rate and Rhythm: Normal rate.  Pulmonary:     Effort: Pulmonary effort is normal. No respiratory distress.     Comments: Breathing comfortably at rest, mild expiratory wheezing noted throughout bilateral lung fields Abdominal:     General: There is no distension.  Musculoskeletal:        General: Normal range of motion.     Cervical back: Neck supple.  Skin:    General: Skin is warm and dry.  Neurological:     Mental Status: He is alert and oriented to person, place, and time.      UC Treatments / Results  Labs (all labs ordered are listed, but only abnormal results are displayed) Labs Reviewed  SARS CORONAVIRUS 2 (TAT 6-24 HRS)    EKG   Radiology No results found.  Procedures Procedures (including critical care time)  Medications Ordered in UC Medications - No data to display  Initial Impression / Assessment and Plan / UC Course  I have reviewed the triage vital signs and the nursing notes.  Pertinent labs & imaging results that were available during my care of the patient were reviewed by me and considered in my medical decision making (see chart for details).     Covid test pending, suspect viral URI with asthma exacerbation recommending symptomatic and supportive care and initiated on prednisone to further help with wheezing and sinus inflammation.  Discussed strict return precautions. Patient verbalized understanding and is agreeable with plan.  Final Clinical Impressions(s) / UC Diagnoses   Final diagnoses:  Viral  URI with cough  Mild intermittent asthma with exacerbation     Discharge Instructions     Covid test pending, monitor my chart for results Begin prednisone daily for 5 days to help with sinus inflammation and wheezing, continue albuterol inhaler as needed Tessalon/benzonatate every 8 hours for cough Mucinex DM for cough and congestion Flonase nasal spray to help with fluid on ears and sinus congestion Rest and fluids Tylenol and ibuprofen as needed for rib pain Follow-up  if any symptoms not improving or worsening    ED Prescriptions    Medication Sig Dispense Auth. Provider   predniSONE (DELTASONE) 50 MG tablet Take 1 tablet (50 mg total) by mouth daily with breakfast for 5 days. 5 tablet Roshanda Balazs C, PA-C   albuterol (VENTOLIN HFA) 108 (90 Base) MCG/ACT inhaler Inhale 1-2 puffs into the lungs every 6 (six) hours as needed for wheezing or shortness of breath. 18 g Vear Staton C, PA-C   benzonatate (TESSALON) 200 MG capsule Take 1 capsule (200 mg total) by mouth 3 (three) times daily as needed for up to 7 days for cough. 28 capsule Savior Himebaugh C, PA-C   fluticasone (FLONASE) 50 MCG/ACT nasal spray Place 1-2 sprays into both nostrils daily. 16 g Kenrick Pore C, PA-C   dextromethorphan-guaiFENesin (MUCINEX DM) 30-600 MG 12hr tablet Take 1 tablet by mouth 2 (two) times daily. 20 tablet May Ozment, [place] C, PA-C     PDMP not reviewed this encounter.   Lew Dawes, PA-C 03/07/20 1450

## 2020-03-07 NOTE — ED Triage Notes (Signed)
Pt reports having cough, congestion, chills, body aches for about 3-4 days. Had a rapid test yesterday results were negative.

## 2020-03-07 NOTE — Discharge Instructions (Signed)
Covid test pending, monitor my chart for results Begin prednisone daily for 5 days to help with sinus inflammation and wheezing, continue albuterol inhaler as needed Tessalon/benzonatate every 8 hours for cough Mucinex DM for cough and congestion Flonase nasal spray to help with fluid on ears and sinus congestion Rest and fluids Tylenol and ibuprofen as needed for rib pain Follow-up if any symptoms not improving or worsening

## 2020-03-18 ENCOUNTER — Emergency Department (INDEPENDENT_AMBULATORY_CARE_PROVIDER_SITE_OTHER)
Admission: EM | Admit: 2020-03-18 | Discharge: 2020-03-18 | Disposition: A | Discharge status: Home / Self Care | Payer: BC Managed Care – PPO | Source: Home / Self Care

## 2020-03-18 ENCOUNTER — Other Ambulatory Visit: Payer: Self-pay

## 2020-03-18 ENCOUNTER — Ambulatory Visit: Payer: Self-pay

## 2020-03-18 DIAGNOSIS — Z202 Contact with and (suspected) exposure to infections with a predominantly sexual mode of transmission: Secondary | ICD-10-CM

## 2020-03-18 NOTE — ED Provider Notes (Signed)
[facility]    CSN: [id number] Arrival date & time: 03/18/20  1614      History   Chief Complaint Chief Complaint  Patient presents with  . SEXUALLY TRANSMITTED DISEASE    asymptomatic    HPI Dan Martinez is a 24 y.o. male.   HPI Dan Martinez is a 24 y.o. male presenting to UC with request for STI testing.  Pt states male partner recommended he get tested for STIs. Pt was not told specifically what to be tested for. Denies having symptoms. States it has been about 1 week since having unprotected sex with this individual.   Past Medical History:  Diagnosis Date  . Asthma     Patient Active Problem List   Diagnosis Date Noted  . 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 left arm surgery  Left 2018       Home Medications    Prior to Admission medications   Medication Sig Start Date End Date Taking? Authorizing Provider  albuterol (VENTOLIN HFA) 108 (90 Base) MCG/ACT inhaler Inhale 1-2 puffs into the lungs every 6 (six) hours as needed for wheezing or shortness of breath. 03/07/20  Yes Wieters, Hallie C, PA-C  dextromethorphan-guaiFENesin (MUCINEX DM) 30-600 MG 12hr tablet Take 1 tablet by mouth 2 (two) times daily. 03/07/20   Wieters, Hallie C, PA-C  fluticasone (FLONASE) 50 MCG/ACT nasal spray Place 1-2 sprays into both nostrils daily. 03/07/20   Wieters, Junius Creamer, PA-C    Family History Family History  Problem Relation Age of Onset  . Sudden death Mother   . Hypertension Father   . Diabetes Father     Social History Social History   Tobacco Use  . Smoking status: Never Smoker  . Smokeless tobacco: Never Used  Vaping Use  . Vaping Use: Never used  Substance Use Topics  . Alcohol use: No    Alcohol/week: 0.0 standard drinks  . Drug use: No     Allergies   Patient has no known allergies.   Review of Systems Review of Systems  Constitutional:  Negative for chills and fever.  Genitourinary: Negative for discharge, dysuria, frequency, genital sores, penile pain and testicular pain.  Skin: Negative for rash.     Physical Exam Triage Vital Signs ED Triage Vitals [03/18/20 1625]  Enc Vitals Group     BP 128/86     Pulse Rate 72     Resp 17     Temp 98 F (36.7 C)     Temp Source Oral     SpO2 99 %     Weight      Height      Head Circumference      Peak Flow      Pain Score 0     Pain Loc      Pain Edu?      Excl. in GC?    No data found.  Updated Vital Signs BP 128/86 (BP Location: Left Arm)   Pulse 72   Temp 98 F (36.7 C) (Oral)   Resp 17   SpO2 99%   Visual Acuity Right Eye Distance:   Left Eye Distance:   Bilateral Distance:    Right Eye Near:   Left Eye Near:    Bilateral Near:     Physical Exam Vitals and nursing note reviewed.  Constitutional:      Appearance: Normal appearance. He is well-developed.  HENT:     Head: Normocephalic and atraumatic.  Cardiovascular:     Rate and Rhythm: Normal rate and regular rhythm.  Pulmonary:     Effort: Pulmonary effort is normal. No respiratory distress.     Breath sounds: Normal breath sounds.  Abdominal:     General: There is no distension.     Palpations: Abdomen is soft.     Tenderness: There is no abdominal tenderness.  Musculoskeletal:        General: Normal range of motion.     Cervical back: Normal range of motion.  Skin:    General: Skin is warm and dry.  Neurological:     Mental Status: He is alert and oriented to person, place, and time.  Psychiatric:        Behavior: Behavior normal.      UC Treatments / Results  Labs (all labs ordered are listed, but only abnormal results are displayed) Labs Reviewed  C. TRACHOMATIS/N. GONORRHOEAE RNA  RPR  HIV ANTIBODY (ROUTINE TESTING W REFLEX)    EKG   Radiology No results found.  Procedures Procedures (including critical care time)  Medications Ordered in UC Medications - No  data to display  Initial Impression / Assessment and Plan / UC Course  I have reviewed the triage vital signs and the nursing notes.  Pertinent labs & imaging results that were available during my care of the patient were reviewed by me and considered in my medical decision making (see chart for details).     Urine sent to lab for testing of GC/chlamydia Blood work sent to lab for HIV and syphilis testing F/u with PCP as needed  Final Clinical Impressions(s) / UC Diagnoses   Final diagnoses:  Possible exposure to STD   Discharge Instructions   None    ED Prescriptions    None     PDMP not reviewed this encounter.   Lurene Shadow, [place] 03/18/20 1648

## 2020-03-18 NOTE — ED Triage Notes (Signed)
Pt states he was advised by his partner to get tested. Pt states she did not advised for what particularly and he is asymptomatic at this time.

## 2020-03-19 LAB — C. TRACHOMATIS/N. GONORRHOEAE RNA
C. trachomatis RNA, TMA: NOT DETECTED
N. gonorrhoeae RNA, TMA: NOT DETECTED

## 2020-03-22 ENCOUNTER — Telehealth: Payer: Self-pay | Admitting: Emergency Medicine

## 2020-03-22 DIAGNOSIS — Z202 Contact with and (suspected) exposure to infections with a predominantly sexual mode of transmission: Secondary | ICD-10-CM

## 2020-03-22 NOTE — Telephone Encounter (Signed)
[facility], Rosalita Chessman, order for RPR and HIV received on [facility] requistion. She can run if I reorder on [facility] requistion. So I reordered and faxed to [phone] as per Suzanne's request. Ortherwise she would have to trash and we would have to recollect.

## 2020-03-23 LAB — HIV ANTIBODY (ROUTINE TESTING W REFLEX): HIV Screen 4th Generation wRfx: NONREACTIVE

## 2020-03-23 LAB — RPR: RPR Ser Ql: NONREACTIVE
